# Patient Record
Sex: Female | Born: 2005 | Race: Black or African American | Hispanic: No | Marital: Single | Smoking: Never smoker
Health system: Southern US, Community
[De-identification: ages and names within clinical notes are randomized; demographics above are authoritative.]

## PROBLEM LIST (undated history)

## (undated) DIAGNOSIS — J40 Bronchitis, not specified as acute or chronic: Secondary | ICD-10-CM

---
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
## 2023-09-10 ENCOUNTER — Ambulatory Visit: Payer: Self-pay

## 2023-09-10 NOTE — Telephone Encounter (Signed)
  Chief Complaint: constipation Symptoms: constipation and bloating Frequency: started 4-5 days ago Pertinent Negatives: Patient denies bleeding Disposition: [] ED /[] Urgent Care (no appt availability in office) / [] Appointment(In office/virtual)/ []  [place] [facility]/ [x] Home Care/ [] Refused Recommended Disposition /[] [place] [facility]/ []  Follow-up with PCP Additional Notes: Patient states that normally she has a bowel movement daily but the last 4-5 days she has not.  She took e-lx on Saturday night and finally had a bowel movement on yesterday.  No blood was in her stool.  Patient states that she has noticed some bloating and abd pain which is intermittent.   Copied from CRM [id number]. Topic: Clinical - Red Word Triage >> Sep 10, 2023 12:13 PM Shelah Lewandowsky wrote: Red Word that prompted transfer to Nurse Triage: has been constipated for about 4-5 days, took laxative a couple days ago and helped a little, still constipated, has caused vomiting Reason for Disposition  MILD constipation  Answer Assessment - Initial Assessment Questions 1. STOOL PATTERN OR FREQUENCY: "How often do you have a bowel movement (BM)?"  (Normal range: 3 times a day to every 3 days)  "When was your last BM?"       Normally every morning  last one was yesterday 2. STRAINING: "Do you have to strain to have a BM?"      no 3. RECTAL PAIN: "Does your rectum hurt when the stool comes out?" If Yes, ask: "Do you have hemorrhoids? How bad is the pain?"  (Scale 1-10; or mild, moderate, severe)     no 4. STOOL COMPOSITION: "Are the stools hard?"      Nt normally 5. BLOOD ON STOOLS: "Has there been any blood on the toilet tissue or on the surface of the BM?" If Yes, ask: "When was the last time?"     no 6. CHRONIC CONSTIPATION: "Is this a new problem for you?"  If No, ask: "How long have you had this problem?" (days, weeks, months)      New started about 4-5 days ago 7. CHANGES IN DIET OR HYDRATION: "Have there been any  recent changes in your diet?" "How much fluids are you drinking on a daily basis?"  "How much have you had to drink today?"     No change 2-3 bottles of water daily 8. MEDICINES: "Have you been taking any new medicines?" "Are you taking any narcotic pain medicines?" (e.g., Dilaudid, morphine, Percocet, Vicodin)     no 9. LAXATIVES: "Have you been using any stool softeners, laxatives, or enemas?"  If Yes, ask "What, how often, and when was the last time?"     Saturday, e-lax 10. ACTIVITY:  "How much walking do you do every day?"  "Has your activity level decreased in the past week?"        Active  11. CAUSE: "What do you think is causing the constipation?"        unknown 12. OTHER SYMPTOMS: "Do you have any other symptoms?" (e.g., abdomen pain, bloating, fever, vomiting)       Bloating, vomiting, abd pain 13. MEDICAL HISTORY: "Do you have a history of hemorrhoids, rectal fissures, or rectal surgery or rectal abscess?"         no  Protocols used: Constipation-A-AH

## 2023-11-21 ENCOUNTER — Encounter: Payer: Self-pay | Admitting: Family Medicine

## 2023-11-21 ENCOUNTER — Ambulatory Visit (INDEPENDENT_AMBULATORY_CARE_PROVIDER_SITE_OTHER): Payer: Self-pay | Admitting: Family Medicine

## 2023-11-21 VITALS — BP 99/65 | HR 57 | Resp 16 | Ht 62.0 in | Wt 108.8 lb

## 2023-11-21 DIAGNOSIS — E038 Other specified hypothyroidism: Secondary | ICD-10-CM | POA: Diagnosis not present

## 2023-11-21 DIAGNOSIS — Z114 Encounter for screening for human immunodeficiency virus [HIV]: Secondary | ICD-10-CM

## 2023-11-21 DIAGNOSIS — E782 Mixed hyperlipidemia: Secondary | ICD-10-CM | POA: Diagnosis not present

## 2023-11-21 DIAGNOSIS — E559 Vitamin D deficiency, unspecified: Secondary | ICD-10-CM

## 2023-11-21 DIAGNOSIS — Z118 Encounter for screening for other infectious and parasitic diseases: Secondary | ICD-10-CM

## 2023-11-21 DIAGNOSIS — Z1159 Encounter for screening for other viral diseases: Secondary | ICD-10-CM | POA: Diagnosis not present

## 2023-11-21 DIAGNOSIS — Z0001 Encounter for general adult medical examination with abnormal findings: Secondary | ICD-10-CM | POA: Diagnosis not present

## 2023-11-21 DIAGNOSIS — R7301 Impaired fasting glucose: Secondary | ICD-10-CM

## 2023-11-21 NOTE — Patient Instructions (Addendum)
        Great to see you today.  I have refilled the medication(s) we provide.    Take MiraLAX: Use 1 capful of MiraLAX mixed with 8 oz of water or juice every other day. This will help soften your stool and make bowel movements easier.  Add a probiotic gummy with fiber to your daily routine. This can help improve your digestion and promote regular bowel movements naturally.   Eat foods that have a lot of fiber, such as: Fresh fruits and vegetables. Whole grains. Beans. Eat less of foods that are low in fiber and high in fat and sugar, such as: [place] fries. Hamburgers. Cookies. Candy. Soda. Drink enough fluid to keep your pee (urine) pale yellow.  If labs were collected, we will inform you of lab results once received either by echart message or telephone call.   - echart message- for normal results that have been seen by the patient already.   - telephone call: abnormal results or if patient has not viewed results in their echart.   - Please take medications as prescribed. - Follow up with your primary health provider if any health concerns arises. - If symptoms worsen please contact your primary care provider and/or visit the emergency department.

## 2023-11-21 NOTE — Assessment & Plan Note (Signed)

## 2023-11-21 NOTE — Progress Notes (Signed)
 Complete physical exam  Patient: Deanna Fox   DOB: April 19, 2006   17 y.o. Female  MRN: [id number]  Subjective:     Chief Complaint  Patient presents with   Establish Care   Constipation    Has been having issues with constipation     Deanna Fox is a 18 y.o. female who presents today for a complete physical exam. She reports consuming a general diet. The patient does not participate in regular exercise at present. She generally feels well. She reports sleeping well. She does have additional problems to discuss today.    Most recent fall risk assessment:     No data to display           Most recent depression screenings:    11/21/2023    8:07 AM  PHQ 2/9 Scores  PHQ - 2 Score 3  PHQ- 9 Score 4    Vision:Not within last year  and Dental: No current dental problems and Receives regular dental care  Patient Care Team: Del Amber Bail, Rogerio Clay, FNP as PCP - General (Family Medicine)   Outpatient Medications Prior to Visit  Medication Sig   [DISCONTINUED] ondansetron  (ZOFRAN  ODT) 4 MG disintegrating tablet Take 1 tablet (4 mg total) by mouth every 8 (eight) hours as needed for nausea.   No facility-administered medications prior to visit.    Review of Systems  Constitutional:  Negative for chills and fever.  HENT:  Negative for ear pain.   Eyes:  Negative for pain.  Respiratory:  Negative for shortness of breath.   Cardiovascular:  Negative for chest pain.  Gastrointestinal:  Negative for abdominal pain and constipation.  Genitourinary:  Negative for dysuria.  Musculoskeletal:  Negative for myalgias.  Skin:  Negative for rash.  Neurological:  Negative for dizziness and headaches.  Psychiatric/Behavioral:  The patient is not nervous/anxious.        Objective:    BP 99/65   Pulse (!) 57   Resp 16   Ht 5\' 2"  (1.575 m)   Wt 108 lb 12.8 oz (49.4 kg)   SpO2 99%   BMI 19.90 kg/m  BP Readings from Last 3 Encounters:  11/21/23 99/65  01/14/22 124/77 (92%,  Z = 1.41 /  92%, Z = 1.41)*  02/25/16 (!) 123/68 (95%, Z = 1.64 /  73%, Z = 0.61)*   *BP percentiles are based on the 2017 AAP Clinical Practice Guideline for girls      Physical Exam Vitals reviewed.  Constitutional:      General: She is not in acute distress.    Appearance: Normal appearance. She is not ill-appearing, toxic-appearing or diaphoretic.  HENT:     Head: Normocephalic.     Right Ear: Ear canal normal.     Left Ear: Ear canal normal.  Eyes:     General:        Right eye: No discharge.        Left eye: No discharge.     Extraocular Movements: Extraocular movements intact.     Conjunctiva/sclera: Conjunctivae normal.  Cardiovascular:     Rate and Rhythm: Normal rate.     Pulses: Normal pulses.     Heart sounds: Normal heart sounds.  Pulmonary:     Effort: Pulmonary effort is normal. No respiratory distress.     Breath sounds: Normal breath sounds.  Abdominal:     General: Bowel sounds are normal.     Palpations: Abdomen is soft.     Tenderness: There  is no abdominal tenderness. There is no right CVA tenderness, left CVA tenderness or guarding.  Musculoskeletal:        General: Normal range of motion.     Cervical back: Normal range of motion.  Skin:    General: Skin is warm and dry.     Capillary Refill: Capillary refill takes less than 2 seconds.  Neurological:     Mental Status: She is alert.     Coordination: Coordination normal.     Gait: Gait normal.  Psychiatric:        Mood and Affect: Mood normal.        Behavior: Behavior normal.      No results found for any visits on 11/21/23.    Assessment & Plan:    Routine Health Maintenance and Physical Exam   There is no immunization history on file for this patient.  Health Maintenance  Topic Date Due   CHLAMYDIA SCREENING  Never done   HPV VACCINES (1 - 3-dose series) Never done   HIV Screening  Never done   Meningococcal B Vaccine (1 of 2 - Standard) Never done   Hepatitis C Screening   Never done   COVID-19 Vaccine (1 - 2024-25 season) Never done   DTaP/Tdap/Td (1 - Tdap) Never done   INFLUENZA VACCINE  01/25/2024    Discussed health benefits of physical activity, and encouraged her to engage in regular exercise appropriate for her age and condition.  Vitamin D deficiency -     VITAMIN D 25 Hydroxy (Vit-D Deficiency, Fractures)  Need for hepatitis C screening test -     Hepatitis C antibody  Encounter for screening for HIV -     HIV Antibody (routine testing w rflx)  TSH (thyroid-stimulating hormone deficiency) -     TSH + free T4  Mixed hyperlipidemia -     Lipid panel -     CMP14+EGFR -     CBC with Differential/Platelet  IFG (impaired fasting glucose) -     Hemoglobin A1c  Screening for chlamydial disease -     Chlamydia/GC NAA, Confirmation  Encounter for routine adult physical exam with abnormal findings Assessment & Plan: A comprehensive physical examination was completed, and necessary labs were ordered. Screening and health maintenance recommendations have been updated. The patient received counseling on exercise and nutrition. BMI was assessed and discussed Advise for heart health, focus on: Eat more fruits and vegetables: Aim for a variety of colors. Choose whole grains: Brown rice, oats, and whole-wheat bread. Limit unhealthy fats: Avoid trans fats; use olive or avocado oil instead. Include lean proteins: Opt for fish, chicken, beans, and legumes. Reduce sodium: Limit processed foods and add less salt. Stay hydrated: Drink plenty of water. Exercise regularly: Aim for at least 30 minutes of moderate exercise, like walking or cycling, 5 days a week.       Return in about 1 year (around 11/20/2024), or if symptoms worsen or fail to improve, for Annual Physical.     Avelino Lek Amber Bail, FNP

## 2023-11-22 ENCOUNTER — Ambulatory Visit: Payer: Self-pay | Admitting: Family Medicine

## 2023-11-22 LAB — CBC WITH DIFFERENTIAL/PLATELET
Basophils Absolute: 0 10*3/uL (ref 0.0–0.2)
Basos: 1 %
EOS (ABSOLUTE): 0.2 10*3/uL (ref 0.0–0.4)
Eos: 5 %
Hematocrit: 37.8 % (ref 34.0–46.6)
Hemoglobin: 12.2 g/dL (ref 11.1–15.9)
Immature Grans (Abs): 0 10*3/uL (ref 0.0–0.1)
Immature Granulocytes: 1 %
Lymphocytes Absolute: 1.5 10*3/uL (ref 0.7–3.1)
Lymphs: 34 %
MCH: 29.8 pg (ref 26.6–33.0)
MCHC: 32.3 g/dL (ref 31.5–35.7)
MCV: 92 fL (ref 79–97)
Monocytes Absolute: 0.4 10*3/uL (ref 0.1–0.9)
Monocytes: 8 %
Neutrophils Absolute: 2.3 10*3/uL (ref 1.4–7.0)
Neutrophils: 51 %
Platelets: 230 10*3/uL (ref 150–450)
RBC: 4.09 x10E6/uL (ref 3.77–5.28)
RDW: 13.1 % (ref 11.7–15.4)
WBC: 4.5 10*3/uL (ref 3.4–10.8)

## 2023-11-22 LAB — CMP14+EGFR
ALT: 14 IU/L (ref 0–32)
AST: 18 IU/L (ref 0–40)
Albumin: 4.6 g/dL (ref 4.0–5.0)
Alkaline Phosphatase: 74 IU/L (ref 42–106)
BUN/Creatinine Ratio: 29 — ABNORMAL HIGH (ref 9–23)
BUN: 20 mg/dL (ref 6–20)
Bilirubin Total: <0.2 mg/dL (ref 0.0–1.2)
CO2: 22 mmol/L (ref 20–29)
Calcium: 9.4 mg/dL (ref 8.7–10.2)
Chloride: 101 mmol/L (ref 96–106)
Creatinine, Ser: 0.7 mg/dL (ref 0.57–1.00)
Globulin, Total: 2.7 g/dL (ref 1.5–4.5)
Glucose: 84 mg/dL (ref 70–99)
Potassium: 4.6 mmol/L (ref 3.5–5.2)
Sodium: 140 mmol/L (ref 134–144)
Total Protein: 7.3 g/dL (ref 6.0–8.5)
eGFR: 128 mL/min/{1.73_m2} (ref >59)

## 2023-11-22 LAB — VITAMIN D 25 HYDROXY (VIT D DEFICIENCY, FRACTURES): Vit D, 25-Hydroxy: 17.1 ng/mL — ABNORMAL LOW (ref 30.0–100.0)

## 2023-11-22 LAB — TSH+FREE T4
Free T4: 1.33 ng/dL (ref 0.93–1.60)
TSH: 1.36 u[IU]/mL (ref 0.450–4.500)

## 2023-11-22 LAB — HIV ANTIBODY (ROUTINE TESTING W REFLEX): HIV Screen 4th Generation wRfx: NONREACTIVE

## 2023-11-22 LAB — LIPID PANEL
Chol/HDL Ratio: 1.8 ratio (ref 0.0–4.4)
Cholesterol, Total: 97 mg/dL — ABNORMAL LOW (ref 100–169)
HDL: 54 mg/dL (ref >39)
LDL Chol Calc (NIH): 33 mg/dL (ref 0–109)
Triglycerides: 31 mg/dL (ref 0–89)
VLDL Cholesterol Cal: 10 mg/dL (ref 5–40)

## 2023-11-22 LAB — HEMOGLOBIN A1C
Est. average glucose Bld gHb Est-mCnc: 97 mg/dL
Hgb A1c MFr Bld: 5 % (ref 4.8–5.6)

## 2023-11-22 LAB — HEPATITIS C ANTIBODY: Hep C Virus Ab: NONREACTIVE

## 2023-11-22 NOTE — Progress Notes (Signed)
 Please inform patient,  All labs normal, expect Vitamin D levels low, I advise to taking  over the counter supplements of vitamin D 1000 IU/day to prevent low vitamin D levels.  Consume Vitamin D-Rich Foods: Fatty Fish: Salmon, mackerel, tuna, and sardines. Egg Yolks: A good source if eaten whole. Fortified Foods: Milk, orange juice, cereals, and plant-based milks (like almond or soy milk). Mushrooms: Especially those exposed to sunlight or UV light. Cod Liver Oil: A concentrated source of vitamin D. Including these foods in your diet can help boost vitamin D levels   Symptoms of Low Vitamin D:  Fatigue and low energy. Bone pain and muscle weakness. Increased risk of fractures or weak bones. Frequent illness or infections. Mood changes, like depression or anxiety. Hair loss or thinning.

## 2023-11-23 LAB — CHLAMYDIA/GC NAA, CONFIRMATION
Chlamydia trachomatis, NAA: NEGATIVE
Neisseria gonorrhoeae, NAA: NEGATIVE

## 2024-01-20 DIAGNOSIS — R110 Nausea: Secondary | ICD-10-CM | POA: Diagnosis not present

## 2024-01-20 DIAGNOSIS — R109 Unspecified abdominal pain: Secondary | ICD-10-CM | POA: Diagnosis not present

## 2024-05-30 DIAGNOSIS — J069 Acute upper respiratory infection, unspecified: Secondary | ICD-10-CM | POA: Diagnosis not present

## 2024-11-18 ENCOUNTER — Encounter: Admitting: Family Medicine
